# Patient Record
Sex: Male | Born: 1970 | Race: White | Hispanic: No | Marital: Married | Smoking: Never smoker
Health system: Southern US, Community
[De-identification: ages and names within clinical notes are randomized; demographics above are authoritative.]

## PROBLEM LIST (undated history)

## (undated) DIAGNOSIS — I472 Ventricular tachycardia: Secondary | ICD-10-CM

## (undated) DIAGNOSIS — T8600 Unspecified complication of bone marrow transplant: Secondary | ICD-10-CM

## (undated) DIAGNOSIS — I4721 Torsades de pointes: Secondary | ICD-10-CM

## (undated) DIAGNOSIS — I2699 Other pulmonary embolism without acute cor pulmonale: Secondary | ICD-10-CM

## (undated) DIAGNOSIS — G629 Polyneuropathy, unspecified: Secondary | ICD-10-CM

## (undated) DIAGNOSIS — C9100 Acute lymphoblastic leukemia not having achieved remission: Secondary | ICD-10-CM

## (undated) DIAGNOSIS — I10 Essential (primary) hypertension: Secondary | ICD-10-CM

## (undated) DIAGNOSIS — I469 Cardiac arrest, cause unspecified: Secondary | ICD-10-CM

## (undated) DIAGNOSIS — I82409 Acute embolism and thrombosis of unspecified deep veins of unspecified lower extremity: Secondary | ICD-10-CM

## (undated) DIAGNOSIS — J45909 Unspecified asthma, uncomplicated: Secondary | ICD-10-CM

## (undated) HISTORY — DX: Polyneuropathy, unspecified: G62.9

## (undated) HISTORY — PX: BONE MARROW TRANSPLANT: SHX200

## (undated) HISTORY — DX: Ventricular tachycardia: I47.2

## (undated) HISTORY — DX: Torsades de pointes: I47.21

## (undated) HISTORY — PX: COLONOSCOPY: SHX174

---

## 1999-06-18 ENCOUNTER — Encounter: Payer: Self-pay | Admitting: Emergency Medicine

## 1999-06-18 ENCOUNTER — Emergency Department (HOSPITAL_COMMUNITY): Admission: EM | Admit: 1999-06-18 | Discharge: 1999-06-18 | Payer: Self-pay | Admitting: Emergency Medicine

## 2004-01-03 ENCOUNTER — Emergency Department (HOSPITAL_COMMUNITY): Admission: EM | Admit: 2004-01-03 | Discharge: 2004-01-03 | Payer: Self-pay | Admitting: Family Medicine

## 2004-03-15 ENCOUNTER — Ambulatory Visit: Payer: Self-pay | Admitting: Internal Medicine

## 2004-03-28 ENCOUNTER — Ambulatory Visit: Payer: Self-pay | Admitting: Pulmonary Disease

## 2004-03-28 ENCOUNTER — Ambulatory Visit (HOSPITAL_BASED_OUTPATIENT_CLINIC_OR_DEPARTMENT_OTHER): Admission: RE | Admit: 2004-03-28 | Discharge: 2004-03-28 | Payer: Self-pay | Admitting: Internal Medicine

## 2004-05-04 ENCOUNTER — Ambulatory Visit: Payer: Self-pay | Admitting: Pulmonary Disease

## 2004-06-08 ENCOUNTER — Ambulatory Visit: Payer: Self-pay | Admitting: Pulmonary Disease

## 2004-08-21 ENCOUNTER — Ambulatory Visit: Payer: Self-pay | Admitting: Internal Medicine

## 2005-07-28 ENCOUNTER — Encounter (INDEPENDENT_AMBULATORY_CARE_PROVIDER_SITE_OTHER): Payer: Self-pay | Admitting: *Deleted

## 2005-07-28 ENCOUNTER — Inpatient Hospital Stay (HOSPITAL_COMMUNITY): Admission: EM | Admit: 2005-07-28 | Discharge: 2005-08-16 | Payer: Self-pay | Admitting: Family Medicine

## 2005-08-06 ENCOUNTER — Ambulatory Visit: Payer: Self-pay | Admitting: Gastroenterology

## 2005-08-09 ENCOUNTER — Encounter (INDEPENDENT_AMBULATORY_CARE_PROVIDER_SITE_OTHER): Payer: Self-pay | Admitting: *Deleted

## 2005-08-16 ENCOUNTER — Encounter: Payer: Self-pay | Admitting: Internal Medicine

## 2005-09-09 ENCOUNTER — Ambulatory Visit: Payer: Self-pay | Admitting: Gastroenterology

## 2005-10-03 ENCOUNTER — Ambulatory Visit: Payer: Self-pay | Admitting: Internal Medicine

## 2005-10-04 ENCOUNTER — Ambulatory Visit: Payer: Self-pay | Admitting: Internal Medicine

## 2005-10-10 ENCOUNTER — Ambulatory Visit: Payer: Self-pay | Admitting: Internal Medicine

## 2005-10-22 ENCOUNTER — Ambulatory Visit: Payer: Self-pay | Admitting: Gastroenterology

## 2005-11-05 ENCOUNTER — Ambulatory Visit: Payer: Self-pay | Admitting: Gastroenterology

## 2006-01-21 ENCOUNTER — Ambulatory Visit: Payer: Self-pay | Admitting: Internal Medicine

## 2006-10-03 ENCOUNTER — Encounter: Payer: Self-pay | Admitting: Internal Medicine

## 2008-08-03 ENCOUNTER — Emergency Department (HOSPITAL_COMMUNITY): Admission: EM | Admit: 2008-08-03 | Discharge: 2008-08-03 | Payer: Self-pay | Admitting: Emergency Medicine

## 2010-06-01 NOTE — H&P (Signed)
NAME:  TRAYTON, SZABO NO.:  [id number]   MEDICAL RECORD NO.:  [id number]          PATIENT TYPE:  INP   LOCATION:  1824                         FACILITY:  [facility]   PHYSICIAN:  Sandria Bales. Ezzard Standing, M.D.  DATE OF BIRTH:  08-27-1970   DATE OF ADMISSION:  07/28/2005  DATE OF DISCHARGE:                                HISTORY & PHYSICAL   HISTORY OF PRESENT ILLNESS:  This is a pleasant, otherwise healthy 40-year-  old white male who has seen Dr. __________ in the past for a possible sleep  disorder, who presents with a history of abdominal pain which began at about  12 o'clock yesterday.  He thought at first it was gas, had some nausea, took  some gas medicines, had some diarrhea, then the pain seemed to worsen last  night and this morning where he came to the emergency room.  He has got some  peritoneal signs, when he hits a bump or coughs it hurts.  He denies any  history of peptic ulcer disease, liver disease, pancreatic disease, or colon  disease.  He had bilateral inguinal hernias repaired as an infant in  [place].   PAST MEDICAL HISTORY:   ALLERGIES:  HE IS ALLERGIC TO SULFA.   MEDICATIONS:  He takes Claritin for allergies on a seasonal basis, Prilosec  for symptomatic reflux on an occasional basis, but is on no chronic  medications.   REVIEW OF SYSTEMS:  NEUROLOGIC:  No seizure or loss of consciousness.  PULMONARY:  Does smoke cigarettes.  No history of pneumonia or tuberculosis.  CARDIAC:  Denies heart disease, chest pain, or hypertension.  GASTROINTESTINAL:  No history of peptic ulcer disease or liver disease, see  history of present illness.  UROLOGIC:  No kidney stones or kidney infections.   His mother is at his bedside, as is I think his brother, his wife is gone to  the beach with their two small children.   He works as a Architect.   PHYSICAL EXAMINATION:  VITAL SIGNS:  His temperature was 101.6, it is now  down to  99.3; his pulse is 86; respirations 16; blood pressure 137/82.   GENERAL:  He is a well-nourished, pleasant, sick-looking white male.  HEENT:  Unremarkable.  NECK:  His neck is supple.  I felt no mass or thyromegaly.  LUNGS:  Clear to auscultation, but only a modest inspiratory effort.  HEART:  He has a regular rate and rhythm.  ABDOMEN:  He has a diffusely quiet abdomen with tenderness and guarding in  the right lower quadrant with mild rebound.  EXTREMITIES:  He has good strength in all four extremities.  NEUROLOGIC:  Grossly intact.   LABORATORY DATA:  A white blood count of 20,700, and a hemoglobin of 15;  hematocrit 45.  His sodium is 135; potassium 3.7; chloride of 107.   IMPRESSION:  My impression is that he has a probable appendicitis, he has  already actually drank contrast.  He certainly has evidence of a surgical  abdomen.  I gave him the option of having a computed tomography  scan which  would delay surgery maybe one to two hours or longer, or proceeding with  surgery.  But I think that clinically he has enough evidence of acute  abdomen and peritonitis he would be best served by proceeding with  laparoscopic exploration with most likely an appendicitis.  Other  possibilities would be diverticulitis, some other inflammatory disease, or  some benign condition, at which time I would still take his appendix out.   The potential complications are included but not limited to bleeding,  infection, possibility of open surgery, possibility of colostomy or  resection of bowel.  He wishes to go ahead with surgery.      Sandria Bales. Ezzard Standing, M.D.  Electronically Signed     DHN/MEDQ  D:  07/28/2005  T:  07/28/2005  Job:  [id number]   cc:   Gordy Savers, M.D. [facility]  [address] [place]  [place] [address]

## 2010-06-01 NOTE — Discharge Summary (Signed)
NAME:  [name], [name] NO.:  [id number]   MEDICAL RECORD NO.:  [id number]           PATIENT TYPE:   LOCATION:                                 FACILITY:   PHYSICIAN:  Sandria Bales. Ezzard Standing, M.D.       DATE OF BIRTH:   DATE OF ADMISSION:  07/28/2005  DATE OF DISCHARGE:  08/16/2005                               DISCHARGE SUMMARY   DISCHARGE DIAGNOSES:  1. Perforated diverticulitis.  2. Wound infection.  3. Gastrointestinal bleed.  4. Malnutrition   OPERATIONS PERFORMED:  1. The patient underwent a laparoscopic appendectomy and oversew of      diverticular perforation on July 28, 2005.  2. This patient underwent sigmoid colectomy with primary anastomosis      on August 09, 2005.   HISTORY OF ILLNESS:  This is a 39 year old white male who is a patient  of Dr. Gordy Savers who presented with a 24-hour history of  abdominal pain which got steadily worse.   His only significant past history is that he had bilateral inguinal  hernias as an infant.   He was allergic to SULFA.   On the day of admission, he presented with a temperature of 101.6 and a  white blood count of 20,700.  He was felt to have probable appendicitis.  He had evidence of a surgical abdomen.  I thought it would be best to  take him to the operating room for exploration.   He underwent a laparoscopic exploration on July 28, 2005 and was found  to actually have a normal-appearing appendix but had evidence of a  sigmoid colon diverticulitis.   I oversewed this laparoscopically and then placed Tisseel on this.   Postoperative, he was kept n.p.o.  He seemed to slowly get better for  about 4 or 5 days, and then seemed to hit a wall by August 06, 2005.  His  pain seemed to be getting worse.  We decreased his diet to clear liquids  and obtained a CT scan.   On August 07, 2005, the CT scan showed evidence of a small perforation,  with fluid to the right of the sigmoid colon.  He was afebrile, had a  normal white blood count, and was not tachycardic, and essentially had a  fairly benign abdominal exam.  However, he had a worsening appearing CT  scan.   I discussed with a patient about the options for further treatment,  which included either feeding, exploration with an ostomy, or attempted  bowel prep, with operation later in the week.   I spent a good amount of time with his wife also talking about the  options.  He did have evidence of a GI bleed, with a drop in his  hemoglobin to around 8.7.  We tried to gently prep his bowel, and then  on August 09, 2005 he ws taken to the operating room, where he underwent a  sigmoid colectomy with primary anastomosis.   Postoperatively, he did well.  He was placed on ticarcillin as an  antibiotic.  By the third postop day, he had  evidence of a wound  infection, so I opened his wound up.  He otherwise seemed to be doing  pretty well.  I had put him on TPN because of his prolonged keeping on  n.p.o.  By August 13, 2005, his fourth postop day, his white blood count  was 9400. his hemoglobin 7.9, and his temperature 99.8.  He appeared to  be slowly improving, again with his wound open, on TPN, on the  ticarcillin.   Finally by August 16, 2005, he was afebrile.  His wound was doing fairly  well, and he was ready for discharge.   DISCHARGE INSTRUCTIONS:  Included eat lightly for several days.  He was  given Vicodin for pain.  We arranged home health care to help with the  nursing with the wound care at home, and he is going to see me back in 7-  10 days for a wound check.  He was actually sent home on Cipro as an  antibiotic and Vicodin for pain, Protonix as needed.  He was also  instructed to follow up Dr. Russella Dar.   His first pathology on his appendix showed mild periappendicitis, but no  other abnormality of the appendix.  His pathology on the colon showed  diverticulitis with perforation and peri-intestinal abscess formation.  He had lymph nodes  which were benign, and there was no evidence of  atypia or malignancy in the specimen.   FINAL DISCHARGE CONDITION:  Good.      Sandria Bales. Ezzard Standing, M.D.  Electronically Signed     DHN/MEDQ  D:  09/24/2006  T:  09/24/2006  Job:  [id number]   cc:   Gordy Savers, MD

## 2010-06-01 NOTE — Op Note (Signed)
NAME:  [name] [name], MOSSA NO.:  [id number]   MEDICAL RECORD NO.:  [id number]          PATIENT TYPE:  INP   LOCATION:  5713                         FACILITY:  [facility]   PHYSICIAN:  Sandria Bales. Ezzard Standing, M.D.  DATE OF BIRTH:  December 23, 1970   DATE OF PROCEDURE:  08/09/2005  DATE OF DISCHARGE:                                 OPERATIVE REPORT   PREOPERATIVE DIAGNOSIS:  Diverticulitis.   POSTOPERATIVE DIAGNOSIS:  Focal diverticulitis of the mid sigmoid colon.   PROCEDURE:  Sigmoid colectomy.   SURGEON:  Sandria Bales. Ezzard Standing, M.D.   FIRST ASSISTANT:  Junious Silk, PA   ANESTHESIA:  General endotracheal.   ESTIMATED BLOOD LOSS:  100 mL.   DRAINS:  Left in:  None.   PROCEDURE:  [name] [name] is a 40 year old white male who actually came to the  emergency room on July 15 with right lower quadrant pain, leukocytosis and  fever, and was felt to have a possible appendicitis.  A laparoscopy of the  appendix was normal, and he had perforated sigmoid diverticular disease.  At  that time, I tried to laparoscopically over-sew the perforation to see if he  could get by without an open laparotomy.   The patient, however, has done well for about a week until he started having  some pain on eating.  A CT scan suggested a possible small abscess around  the sigmoid colon.   I spent a long time talking to the patient and his wife about the options  for either trying to continue to feed him versus prepping his bowel and see  if we could resect either a segment of bowel or give him a colostomy.   The indications and potential complications of the procedure were explained  to the patient.  The potential complications include, but are not limited  to, bleeding, infection, the need for a colostomy.   OPERATIVE NOTE:  The patient now comes to the operating room.  He has been marked for a  colostomy site.  His abdomen is shaved.  He has a Foley catheter in place,  an NG tube in place, and is on  Tygacil; actually, he has been on this since,  I think, he first came in the door.   The abdomen was prepped with Betadine solution and sterilely draped.  I went  through a low midline incision and entered the abdominal cavity.  He did  have an inflammatory mass in the anterior wall of his lower abdomen, and I  took this down and the small bowel and the colon were stuck up against it.   I did over sew a serosal tear of the small bowel.  The small bowel was  otherwise okay.  I took down some adhesions of the small bowel.  The colon  looked as if it had tried to wall itself off, and my guess is that if we  could have stayed out of his belly or kept him n.p.o. for maybe another 5-7  days, this may have worked.  He really had no contamination.  His focus of  diverticular disease covered  only about a 2 to 3 cm segment of the sigmoid  colon, and therefore I thought it was worth going on and trying to do a  primary resection with anastomosis.   I resected probably about a 6 to 8 inch segment of sigmoid colon, which was  the mid sigmoid colon.  I divided the proximal and distal end with Kocher  clamps.  I used Kelly clamps, 2-0 silk sutures, and the Ligasure to divide  the mesentery of the colon.  I really did not go down very far in the  mesentery of the sigmoid colon.  I never got into the retroperitoneum, and  therefore I did not try to identify a ureter.  After dividing the colon, I  then did a hand-sewn end-to-end anastomosis using interrupted 3-0 silk  sutures in an inverting fashion with Gambee inverting sutures on the  anterior part of the colon.   I inspected the colon and could easily get a fingerbreadth or a  fingerbreadth and a half through the anastomosis.  I probed it with a  hemostat and saw no defects in my anastomosis.  I then irrigated the abdomen  and closed the mesentery with 2-0 silk sutures.  I then went back and looked  at the small bowel again, and it all looked okay  except for the area that  had been stuck up to the abscess.  He really had no contamination except for  his loculated inflammatory mass in his lower midline abdomen.  I irrigated  his abdomen with 4 L of saline.  I checked for the NG tube placement.  At  the end of the procedure, I did bring the omentum down and tried to cover  the wound, and tried to bring the omentum down all the way to the pelvis and  his sigmoid anastomosis.  I did infiltrate the fascia with 30 mL of  Marcaine.  I then closed the wound with 2 running #1-PDS sutures that I tied  in the mid wound.  I did place 2 additional PDS sutures during the  procedure.   The subcutaneous tissue was irrigated, the skin closed with a skin gun.  The  patient tolerated the procedure well and was transported to the recovery  room in good condition.  Sponge and needle counts were correct at the end of  the case.      Sandria Bales. Ezzard Standing, M.D.  Electronically Signed     DHN/MEDQ  D:  08/09/2005  T:  08/10/2005  Job:  [id number]   cc:   Gordy Savers, M.D. [facility]  [address] [place]  [place] [address]

## 2010-06-01 NOTE — Op Note (Signed)
NAME:  [name] [name], [name] [name] NO.:  [id number]   MEDICAL RECORD NO.:  [id number]          PATIENT TYPE:  INP   LOCATION:  1824                         FACILITY:  [facility]   PHYSICIAN:  Sandria Bales. Ezzard Standing, M.D.  DATE OF BIRTH:  Dec 24, 1970   DATE OF PROCEDURE:  07/28/2005  DATE OF DISCHARGE:                                 OPERATIVE REPORT   PREOPERATIVE DIAGNOSIS:  Appendicitis.   POSTOPERATIVE DIAGNOSIS:  Perforated sigmoid colon diverticulitis.   PROCEDURE:  Appendectomy and oversew of diverticular perforation with  application of Tisseel.   SURGEON:  Ovidio Kin, M.D.   ANESTHESIA:  General endotracheal.   ESTIMATED BLOOD LOSS:  Minimal.   INDICATIONS FOR PROCEDURE:  [name] [name] is a 40 year old white male, who is  in otherwise good health, who over the last 36 hours has had increasing  abdominal pain.  He at first thought he had gas and presented to the [facility] Emergency Room with increasing abdominal tenderness, leukocytosis,  fever, and signs and symptoms of probable appendicitis.  He drank contrast  and I offered him proceeding with CT scan versus just going to the OR, and  he decided to go to the OR for laparoscopic exploration and probable  appendectomy.   The patient agreed to go ahead with surgery.  I discussed with him the  options for treatment, which include laparoscopic appendectomy, also the  possibility this could be something else, such as diverticular disease or  Crohn's disease, and the risks including bleeding, infection, possibility of  open laparotomy and end colostomy.   OPERATIVE NOTE:  The patient placed in a supine position with the left arm  tucked and right arm out.  Foley catheter in place.  NG tube in place.  Given 2 g of cefoxitin at initiation of procedure.   The patient's abdomen was then prepped and draped with Betadine solution and  sterilely draped.  An infraumbilical incision was made with sharp dissection  and carried down  to the abdominal cavity.  A 0-degree 10-mm laparoscope was  inserted through a 12-mm Hasson trocar, and the Hasson trocar was secured  with a 0-Vicryl suture.  A 5-mm trocar was placed in the right upper  quadrant, a 10-mm trocar in the left lower quadrant.  Upon entering the  abdominal cavity, there was free purulent fluid of which I obtained cultures  for aerobes and anaerobes.   I then irrigated out the abdomen and identified first his appendix, which  appeared normal.  What was impressive is to the midline, maybe just a little  to the right of the midline, he had a loop of sigmoid colon and small bowel  stuck up onto the anterior peitoneal surface.  I pulled the small bowel  first, which was clearly walling off, or attempting to wall off, a  perforation and then freed up the sigmoid colon and identified where I  thought was the location of the perforation.   I was able to place an instrument where  the perforation was, which was not  much more than about 7 or 8 mm in size.  I thought it was amenable to  possible oversewing, irrigation, and a primary closure with a delayed colon  surgery at a later date.   I placed two 2-0 Vicryl sutures around where the perforation was located.  I  then covered this wound with Tisseel.  I irrigated the abdomen with 5 L of  saline to it being totally clear.   The remainder of the abdominal exploration was unremarkable.  The liver,  Gall bladder,and stomach all appeared normal.   I then removed the trocars, and in turn there was no bleeding at the trocar  sites.  The umbilical port was closed with 0-Vicryl suture.  The skin was  closed with 5-0 Vicryl suture, painted with tincture of benzoin and Steri-  Strips.  The patient tolerated the procedure well and was transported to the  recovery room in good condition.  Sponge and needle counts were correct at  the end of the case.      Sandria Bales. Ezzard Standing, M.D.  Electronically Signed     DHN/MEDQ   D:  07/29/2005  T:  07/29/2005  Job:  [id number]   cc:   Gordy Savers, M.D. [facility]  [address] [place]  [place] [address]

## 2010-06-01 NOTE — Procedures (Signed)
NAME:  [name] [name], WAHLSTROM NO.:  [id number]   MEDICAL RECORD NO.:  [id number]          PATIENT TYPE:  OUT   LOCATION:  SLEEP CENTER                 FACILITY:  [facility]   PHYSICIAN:  Marcelyn Bruins, M.D. [facility] DATE OF BIRTH:  1970/03/25   DATE OF STUDY:  03/28/2004                              NOCTURNAL POLYSOMNOGRAM   DATE OF STUDY:  March 28, 2004   REFERRING PHYSICIAN:  Dr. Eleonore Chiquito   EPWORTH SCORE:  16   SLEEP ARCHITECTURE:  The patient had a total sleep time of 365 minutes with  decreased REM and slow wave sleep. Sleep efficiency was 80%. Sleep onset  latency was prolonged at 72 minutes and REM latency was at the upper limits  of normal.   IMPRESSION:  1.  Small numbers of obstructive events which do not meet the respiratory      disturbance index criteria for the obstructive sleep apnea syndrome. The      patient did have mild to moderate snoring and moderate numbers of      nonspecific arousals. This raises the question of the upper airway      resistant syndrome, especially in light of his history of choking      episodes during the night. Clinical correlation is suggested, however if      the patient is experiencing severe daytime sleepiness referral for sleep      medicine evaluation is recommended.  2.  No clinically significant cardiac arrhythmias.  3.  Large numbers of leg jerks with mild to moderate sleep disruption.      Again, clinical correlation is suggested.      KC/MEDQ  D:  04/05/2004 13:01:42  T:  04/05/2004 18:13:56  Job:  [id number]

## 2010-06-01 NOTE — Assessment & Plan Note (Signed)
[facility]                              [facility] OFFICE NOTE   NAME:Wedekind, LADELL LEA                     MRN:          [id number]  DATE:10/10/2005                            DOB:          10-16-1970    The patient is a 40 year old gentleman who is seen today for a health exam.  He was hospitalized two months ago for focal diverticulitis of the mid  sigmoid.  He required a sigmoid colectomy at that time.  He has seen Dr.  Russella Dar and is scheduled for a full colonoscopy later this year.  His past  medical history is otherwise fairly unremarkable.  He has seen Dr. Shelle Iron in  the past for possible obstructive sleep apnea.  This was excluded.  He is  felt to have restless leg syndrome but presently is doing well off  medication.   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  Due to his recent illness, he is  presently on Medicaid.  He is married with children.   PHYSICAL EXAMINATION:  GENERAL:  Revealed a mildly overweight male in no  acute distress.  VITAL SIGNS:  Blood pressure 120/80 on repeat.  HEENT:  Fundi, ear, nose and throat clear.  NECK:  No adenopathy or thyroid enlargement.  CHEST:  Clear.  CARDIOVASCULAR:  Normal heart sounds, no murmurs.  ABDOMEN:  Soft and nontender.  He did have a surgical scar below the  umbilicus with secondary healing.  GENITOURINARY:  External genitalia normal.  EXTREMITIES:  Negative with full peripheral pulses.   IMPRESSION:  Status post sigmoid colectomy for diverticular disease.  History of restless leg syndrome.  Mild exogenous obesity.   DISPOSITION:  He will be maintained on a high fiber diet and off  medications.  Return here in one or two years or p.r.n.            ______________________________  Gordy Savers, MD     PFK/MedQ  DD:  10/10/2005  DT:  10/12/2005  Job #:  [id number]

## 2010-06-01 NOTE — Assessment & Plan Note (Signed)
[facility]                           GASTROENTEROLOGY OFFICE NOTE   NAME:[name], [name] EMEL                     MRN:          [id number]  DATE:09/09/2005                            DOB:          [birth year]    [name] [name] returns following hospitalization for sigmoid colon  diverticulitis, status post sigmoid colectomy.  He has melena and  hematemesis during his hospitalization.  Upper endoscopy revealed erosive  duodenitis and non-erosive gastritis.  RUT was negative.  All NSAID products  were discontinued and he had no recurrent bleeding.  He has been maintained  on Prilosec since discharge and has an occasional episode of reflux.  He was  previously on Protonix for reflux and this seemed to give him better control  of his symptoms.  He notes no recurrent bleeding, change in bowel habits or  change in stool caliber.  He has some mild abdominal pain at his would that  is healing by secondary intention.   CURRENT MEDICATIONS:  Listed on the chart of date reviewed.   ALLERGIES:  Medication allergies SULFA DRUGS and ATIVAN.   EXAMINATION:  GENERAL:  No acute distress.  VITAL SIGNS:  Height 5 feet 11 inches.  Weight 198.4.  Blood pressure  122/64.  Pulse 76 and regular.  CHEST:  Clear to auscultation bilaterally.  CARDIAC:  Regular rate and rhythm without murmurs appreciated.  ABDOMEN:  Soft, non-tender with normoactive bowel sounds. There is a wound  healing by secondary intention in his mid-abdomen with very mild tenderness  adjacent to the wound.   ASSESSMENT/PLAN:  1. Erosive duodenitis.  Non-erosive gastritis. Gastroesophageal reflux      disease.  Maintained on proton pump inhibitor on a daily basis.  Will      change to Protonix 40 mg q. day for improved symptom control.  Continue      to avoid aspirin and nonsteroidal antiinflammatory drug products.  2. Status post sigmoid colectomy for diverticulitis.  Will plan to proceed      with  colonoscopy to screen for other colonic disorders once he has more      fully recovered from his sigmoid colectomy.  This will be tentatively      scheduled for October or November.                                   Venita Lick. Pleas Koch., MD, Clement[facility] Graham   MTS/MedQ  DD:  09/09/2005  DT:  09/10/2005  Job #:  [id number]   cc:   Sandria Bales. Ezzard Standing, MD

## 2011-02-25 ENCOUNTER — Emergency Department (HOSPITAL_COMMUNITY)
Admission: EM | Admit: 2011-02-25 | Discharge: 2011-02-25 | Disposition: A | Discharge status: Other Acute Inpatient Hospital | Payer: Medicaid Other | Attending: Emergency Medicine | Admitting: Emergency Medicine

## 2011-02-25 ENCOUNTER — Emergency Department (HOSPITAL_COMMUNITY): Payer: Medicaid Other

## 2011-02-25 ENCOUNTER — Encounter (HOSPITAL_COMMUNITY): Payer: Self-pay

## 2011-02-25 DIAGNOSIS — R509 Fever, unspecified: Secondary | ICD-10-CM | POA: Insufficient documentation

## 2011-02-25 DIAGNOSIS — C9100 Acute lymphoblastic leukemia not having achieved remission: Secondary | ICD-10-CM | POA: Insufficient documentation

## 2011-02-25 DIAGNOSIS — R197 Diarrhea, unspecified: Secondary | ICD-10-CM

## 2011-02-25 DIAGNOSIS — R000 Tachycardia, unspecified: Secondary | ICD-10-CM | POA: Insufficient documentation

## 2011-02-25 DIAGNOSIS — Z9481 Bone marrow transplant status: Secondary | ICD-10-CM | POA: Insufficient documentation

## 2011-02-25 DIAGNOSIS — R112 Nausea with vomiting, unspecified: Secondary | ICD-10-CM | POA: Insufficient documentation

## 2011-02-25 HISTORY — DX: Unspecified complication of bone marrow transplant: T86.00

## 2011-02-25 LAB — COMPREHENSIVE METABOLIC PANEL
AST: 35 U/L (ref 0–37)
Alkaline Phosphatase: 80 U/L (ref 39–117)
BUN: 12 mg/dL (ref 6–23)
Calcium: 8.9 mg/dL (ref 8.4–10.5)
GFR calc Af Amer: >90 mL/min (ref >90)
GFR calc non Af Amer: 82 mL/min — ABNORMAL LOW (ref >90)
Glucose, Bld: 181 mg/dL — ABNORMAL HIGH (ref 70–99)
Potassium: 3.6 mEq/L (ref 3.5–5.1)
Total Bilirubin: 0.2 mg/dL — ABNORMAL LOW (ref 0.3–1.2)
Total Protein: 5.9 g/dL — ABNORMAL LOW (ref 6.0–8.3)

## 2011-02-25 LAB — DIFFERENTIAL
Eosinophils Absolute: 0.4 10*3/uL (ref 0.0–0.7)
Eosinophils Relative: 3 % (ref 0–5)
Lymphocytes Relative: 17 % (ref 12–46)
Monocytes Relative: 11 % (ref 3–12)
Neutro Abs: 7.6 10*3/uL (ref 1.7–7.7)
Neutrophils Relative %: 70 % (ref 43–77)

## 2011-02-25 LAB — URINALYSIS, ROUTINE W REFLEX MICROSCOPIC
Bilirubin Urine: NEGATIVE
Ketones, ur: NEGATIVE mg/dL
Specific Gravity, Urine: 1.019 (ref 1.005–1.030)
Urobilinogen, UA: 0.2 mg/dL (ref 0.0–1.0)
pH: 6 (ref 5.0–8.0)

## 2011-02-25 LAB — CBC
Hemoglobin: 12.7 g/dL — ABNORMAL LOW (ref 13.0–17.0)
MCH: 32.5 pg (ref 26.0–34.0)
MCV: 94.6 fL (ref 78.0–100.0)
RDW: 14.2 % (ref 11.5–15.5)

## 2011-02-25 MED ORDER — DEXTROSE 5 % IV SOLN
3.3750 g | Freq: Once | INTRAVENOUS | Status: AC
  Administered 2011-02-25: 3.375 g via INTRAVENOUS
  Filled 2011-02-25: qty 3.38

## 2011-02-25 MED ORDER — ACETAMINOPHEN 325 MG PO TABS
650.0000 mg | ORAL_TABLET | Freq: Once | ORAL | Status: AC
  Administered 2011-02-25: 650 mg via ORAL
  Filled 2011-02-25: qty 2

## 2011-02-25 MED ORDER — SODIUM CHLORIDE 0.9 % IV BOLUS (SEPSIS)
1000.0000 mL | Freq: Once | INTRAVENOUS | Status: AC
  Administered 2011-02-25: 1000 mL via INTRAVENOUS

## 2011-02-25 MED ORDER — HYDROMORPHONE HCL PF 1 MG/ML IJ SOLN
1.0000 mg | Freq: Once | INTRAMUSCULAR | Status: AC
  Administered 2011-02-25: 1 mg via INTRAVENOUS
  Filled 2011-02-25: qty 1

## 2011-02-25 MED ORDER — VANCOMYCIN HCL IN DEXTROSE 1-5 GM/200ML-% IV SOLN
1000.0000 mg | Freq: Once | INTRAVENOUS | Status: AC
  Administered 2011-02-25: 1000 mg via INTRAVENOUS
  Filled 2011-02-25: qty 200

## 2011-02-25 MED ORDER — PROCHLORPERAZINE EDISYLATE 5 MG/ML IJ SOLN
10.0000 mg | Freq: Four times a day (QID) | INTRAMUSCULAR | Status: DC | PRN
  Administered 2011-02-25: 10 mg via INTRAVENOUS
  Filled 2011-02-25: qty 2

## 2011-02-25 NOTE — ED Notes (Signed)
Dr. Read Drivers at the bedside talking with pt and family member.

## 2011-02-25 NOTE — ED Provider Notes (Signed)
History     CSN: [id number]  Arrival date & time 02/25/11  0209   None     Chief Complaint  Patient presents with  . N/V/D      HPI  History provided by the patient and spouse. 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 Patient is still on suppressive treatments from his transplant. Symptoms came on acutely and are persistent. Symptoms are described as severe. There are associated with fever, chills and riggers. Patient reports having similar symptoms several months ago requiring immediate intervention and hospitalizations. Patient denies any recent travel or known sick contacts. Denies any other symptoms. Patient has not taken anything for his symptoms to this point.     Past Medical History  Diagnosis Date  . Bone marrow transplant complication     History reviewed. No pertinent past surgical history.  History reviewed. No pertinent family history.  History  Substance Use Topics  . Smoking status: Not on file  . Smokeless tobacco: Not on file  . Alcohol Use: No      Review of Systems  Allergies  Sulfonamide derivatives  Home Medications  No current outpatient prescriptions on file.  BP 129/77  Pulse 118  Temp(Src) 99.3 F (37.4 C) (Oral)  Resp 18  SpO2 97%  Physical Exam  Nursing note and vitals reviewed. Constitutional: He is oriented to person, place, and time. He appears well-developed and well-nourished. No distress.       Active riggers  HENT:  Head: Normocephalic and atraumatic.  Neck: Normal range of motion. Neck supple.  Cardiovascular: Regular rhythm.  Tachycardia present.   Pulmonary/Chest: Effort normal and breath sounds normal. No respiratory distress. He has no wheezes.  Abdominal: Soft. Bowel sounds are normal. He exhibits no distension. There is no tenderness. There  is no rebound and no guarding.  Neurological: He is alert and oriented to person, place, and time.  Skin: Skin is warm and dry. No rash noted.  Psychiatric: He has a normal mood and affect. His behavior is normal.    ED Course  Procedures    Labs Reviewed  CBC  DIFFERENTIAL  COMPREHENSIVE METABOLIC PANEL  URINALYSIS, ROUTINE W REFLEX MICROSCOPIC  URINE CULTURE  CULTURE, BLOOD (ROUTINE X 2)  CULTURE, BLOOD (ROUTINE X 2)  LACTIC ACID, PLASMA   Results for orders placed during the hospital encounter of 02/25/11  CBC      Component Value Range   WBC 11.0 (*) 4.0 - 10.5 (K/uL)   RBC 3.91 (*) 4.22 - 5.81 (MIL/uL)   Hemoglobin 12.7 (*) 13.0 - 17.0 (g/dL)   HCT 21.3 (*) 08.6 - 52.0 (%)   MCV 94.6  78.0 - 100.0 (fL)   MCH 32.5  26.0 - 34.0 (pg)   MCHC 34.3  30.0 - 36.0 (g/dL)   RDW 57.8  46.9 - 62.9 (%)   Platelets 286  150 - 400 (K/uL)  DIFFERENTIAL      Component Value Range   Neutrophils Relative 70  43 - 77 (%)   Neutro Abs 7.6  1.7 - 7.7 (K/uL)   Lymphocytes Relative 17  12 - 46 (%)   Lymphs Abs 1.8  0.7 - 4.0 (K/uL)   Monocytes Relative 11  3 - 12 (%)   Monocytes Absolute 1.2 (*) 0.1 - 1.0 (K/uL)   Eosinophils Relative 3  0 - 5 (%)   Eosinophils Absolute 0.4  0.0 - 0.7 (K/uL)   Basophils Relative 0  0 - 1 (%)   Basophils Absolute 0.0  0.0 - 0.1 (K/uL)  COMPREHENSIVE METABOLIC PANEL      Component Value Range   Sodium 138  135 - 145 (mEq/L)   Potassium 3.6  3.5 - 5.1 (mEq/L)   Chloride 102  96 - 112 (mEq/L)   CO2 23  19 - 32 (mEq/L)   Glucose, Bld 181 (*) 70 - 99 (mg/dL)   BUN 12  6 - 23 (mg/dL)   Creatinine, Ser 4.09  0.50 - 1.35 (mg/dL)   Calcium 8.9  8.4 - 81.1 (mg/dL)   Total Protein 5.9 (*) 6.0 - 8.3 (g/dL)   Albumin 3.4 (*) 3.5 - 5.2 (g/dL)   AST 35  0 - 37 (U/L)   ALT 42  0 - 53 (U/L)   Alkaline Phosphatase 80  39 - 117 (U/L)   Total Bilirubin 0.2 (*) 0.3 - 1.2 (mg/dL)   GFR calc non Af Amer 82 (*) >90 (mL/min)   GFR calc Af Amer >90  >90 (mL/min)    URINALYSIS, ROUTINE W REFLEX MICROSCOPIC      Component Value Range   Color, Urine YELLOW  YELLOW    APPearance CLOUDY (*) CLEAR    Specific Gravity, Urine 1.019  1.005 - 1.030    pH 6.0  5.0 - 8.0    Glucose, UA NEGATIVE  NEGATIVE (mg/dL)   Hgb urine dipstick NEGATIVE  NEGATIVE    Bilirubin Urine NEGATIVE  NEGATIVE    Ketones, ur NEGATIVE  NEGATIVE (mg/dL)   Protein, ur NEGATIVE  NEGATIVE (mg/dL)   Urobilinogen, UA 0.2  0.0 - 1.0 (mg/dL)   Nitrite NEGATIVE  NEGATIVE    Leukocytes, UA NEGATIVE  NEGATIVE   LACTIC ACID, PLASMA      Component Value Range   Lactic Acid, Venous 3.0 (*) 0.5 - 2.2 (mmol/L)      Dg Chest 2 View  02/25/2011  *RADIOLOGY REPORT*  Clinical Data: Fever  CHEST - 2 VIEW  Comparison: 08/11/2005  Findings: Shallow inspiration.  Mild cardiac enlargement with normal pulmonary vascularity.  No focal airspace consolidation.  No blunting of costophrenic angles.  No pneumothorax.  Right sided power port central venous catheter with tip over the upper right atrium.  IMPRESSION: Shallow inspiration.  No evidence of active consolidation.  Original Report Authenticated By: Marlon Pel, M.D.     1. Fever       MDM  2:40 AM patient seen and evaluated. Patient in no acute distress but with active riggers.  Patient follows at [facility] bone marrow unit at [phone] they called Dr. Catha Nottingham on call who encouraged him to come to the emergency room immediately.  Patient seen and discussed with attending physician. He has made consultation at [facility]. Plan to have patient transferred. Patient currently stable with improvement of temperature after Tylenol. Antibiotics have been started.     Phill Mutter [place], [place] 02/25/11 [phone]

## 2011-02-25 NOTE — ED Notes (Signed)
Pt is 14 months out of his transplant, tonight at 2330 he started vomiting and having diarrhea

## 2011-02-25 NOTE — ED Notes (Signed)
Pt. Has been given ice packs for under the arms and the groin area to reduce 103 temp. PER RN

## 2011-02-25 NOTE — ED Notes (Signed)
[facility] transportation here for pt

## 2011-02-25 NOTE — ED Notes (Signed)
Spoke with Jess with Research scientist (life sciences) at [facility]. Transport vehicle en route to [facility] to pick up pt. Nursing report given to Britta Mccreedy, RN at [facility] and pt will be admitted to room 4835. Informed Wife and pt of room assignment.

## 2011-02-25 NOTE — ED Provider Notes (Signed)
Medical screening examination/treatment/procedure(s) were conducted as a shared visit with non-physician practitioner(s) and myself.  I personally evaluated the patient during the encounter  Patient evaluated. Vital signs are stable. Patient is sleeping. Patient's wife updated about patient's condition and plan. Arrangements made for transfer to [facility].  Hanley Seamen, MD 02/25/11 [phone]

## 2011-02-25 NOTE — ED Notes (Signed)
Report given to [facility] transportation [place]

## 2011-02-25 NOTE — ED Notes (Signed)
Pt has leukemia and has had a bone marrow transplant in 2011. Pt came in ER with elevated temperature and muscle spasms. Pt and wife report that patient usually goes to [facility] to be treated but came to [place] because of his fever. Pt has right subclavian port a cath that was accessed, blood work collected, pain medicine and antiemetic were administered per EDP orders. Pt's rectal temp found to be 103F and tylenol administered for it per EDP orders.

## 2011-02-26 LAB — URINE CULTURE
Colony Count: NO GROWTH
Culture  Setup Time: 201302110838

## 2011-03-03 LAB — CULTURE, BLOOD (ROUTINE X 2)
Culture  Setup Time: 201302110835
Culture: NO GROWTH
Culture: NO GROWTH

## 2012-07-03 ENCOUNTER — Encounter: Payer: Self-pay | Admitting: Gastroenterology

## 2012-08-05 ENCOUNTER — Emergency Department (HOSPITAL_COMMUNITY)
Admission: EM | Admit: 2012-08-05 | Discharge: 2012-08-05 | Disposition: A | Discharge status: Other Acute Inpatient Hospital | Payer: Medicare Other | Attending: Emergency Medicine | Admitting: Emergency Medicine

## 2012-08-05 ENCOUNTER — Emergency Department (HOSPITAL_COMMUNITY): Payer: Medicare Other

## 2012-08-05 ENCOUNTER — Encounter (HOSPITAL_COMMUNITY): Payer: Self-pay | Admitting: *Deleted

## 2012-08-05 DIAGNOSIS — Z856 Personal history of leukemia: Secondary | ICD-10-CM | POA: Insufficient documentation

## 2012-08-05 DIAGNOSIS — Z79899 Other long term (current) drug therapy: Secondary | ICD-10-CM | POA: Insufficient documentation

## 2012-08-05 DIAGNOSIS — Z9481 Bone marrow transplant status: Secondary | ICD-10-CM | POA: Insufficient documentation

## 2012-08-05 DIAGNOSIS — I10 Essential (primary) hypertension: Secondary | ICD-10-CM | POA: Insufficient documentation

## 2012-08-05 DIAGNOSIS — L03115 Cellulitis of right lower limb: Secondary | ICD-10-CM

## 2012-08-05 DIAGNOSIS — Z8679 Personal history of other diseases of the circulatory system: Secondary | ICD-10-CM | POA: Insufficient documentation

## 2012-08-05 DIAGNOSIS — R0609 Other forms of dyspnea: Secondary | ICD-10-CM | POA: Insufficient documentation

## 2012-08-05 DIAGNOSIS — J45909 Unspecified asthma, uncomplicated: Secondary | ICD-10-CM | POA: Insufficient documentation

## 2012-08-05 DIAGNOSIS — R0989 Other specified symptoms and signs involving the circulatory and respiratory systems: Secondary | ICD-10-CM | POA: Insufficient documentation

## 2012-08-05 DIAGNOSIS — M79609 Pain in unspecified limb: Secondary | ICD-10-CM | POA: Insufficient documentation

## 2012-08-05 DIAGNOSIS — R509 Fever, unspecified: Secondary | ICD-10-CM | POA: Insufficient documentation

## 2012-08-05 DIAGNOSIS — L02419 Cutaneous abscess of limb, unspecified: Secondary | ICD-10-CM | POA: Insufficient documentation

## 2012-08-05 HISTORY — DX: Acute embolism and thrombosis of unspecified deep veins of unspecified lower extremity: I82.409

## 2012-08-05 HISTORY — DX: Other pulmonary embolism without acute cor pulmonale: I26.99

## 2012-08-05 HISTORY — DX: Essential (primary) hypertension: I10

## 2012-08-05 HISTORY — DX: Cardiac arrest, cause unspecified: I46.9

## 2012-08-05 HISTORY — DX: Unspecified asthma, uncomplicated: J45.909

## 2012-08-05 HISTORY — DX: Acute lymphoblastic leukemia not having achieved remission: C91.00

## 2012-08-05 LAB — COMPREHENSIVE METABOLIC PANEL
ALT: 44 U/L (ref 0–53)
Alkaline Phosphatase: 92 U/L (ref 39–117)
CO2: 24 mEq/L (ref 19–32)
Chloride: 97 mEq/L (ref 96–112)
GFR calc Af Amer: >90 mL/min (ref >90)
Glucose, Bld: 185 mg/dL — ABNORMAL HIGH (ref 70–99)
Potassium: 3.7 mEq/L (ref 3.5–5.1)
Sodium: 134 mEq/L — ABNORMAL LOW (ref 135–145)
Total Protein: 6.1 g/dL (ref 6.0–8.3)

## 2012-08-05 LAB — URINALYSIS, ROUTINE W REFLEX MICROSCOPIC
Glucose, UA: NEGATIVE mg/dL
Hgb urine dipstick: NEGATIVE
Ketones, ur: NEGATIVE mg/dL
Protein, ur: NEGATIVE mg/dL

## 2012-08-05 LAB — CBC WITH DIFFERENTIAL/PLATELET
Eosinophils Absolute: 0.5 10*3/uL (ref 0.0–0.7)
Lymphocytes Relative: 12 % (ref 12–46)
Lymphs Abs: 1.7 10*3/uL (ref 0.7–4.0)
Neutro Abs: 9.8 10*3/uL — ABNORMAL HIGH (ref 1.7–7.7)
Neutrophils Relative %: 73 % (ref 43–77)
Platelets: 359 10*3/uL (ref 150–400)
RBC: 4.26 MIL/uL (ref 4.22–5.81)
WBC: 13.4 10*3/uL — ABNORMAL HIGH (ref 4.0–10.5)

## 2012-08-05 MED ORDER — VANCOMYCIN HCL 10 G IV SOLR
1250.0000 mg | Freq: Two times a day (BID) | INTRAVENOUS | Status: DC
  Filled 2012-08-05: qty 1250

## 2012-08-05 MED ORDER — LORAZEPAM 2 MG/ML IJ SOLN
1.0000 mg | Freq: Once | INTRAMUSCULAR | Status: AC
  Administered 2012-08-05: 1 mg via INTRAVENOUS
  Filled 2012-08-05: qty 1

## 2012-08-05 MED ORDER — MORPHINE SULFATE 4 MG/ML IJ SOLN
4.0000 mg | Freq: Once | INTRAMUSCULAR | Status: AC
  Administered 2012-08-05: 4 mg via INTRAVENOUS
  Filled 2012-08-05: qty 1

## 2012-08-05 MED ORDER — ACETAMINOPHEN 500 MG PO TABS
1000.0000 mg | ORAL_TABLET | Freq: Once | ORAL | Status: DC
  Filled 2012-08-05: qty 2

## 2012-08-05 MED ORDER — ONDANSETRON HCL 4 MG/2ML IJ SOLN
4.0000 mg | Freq: Once | INTRAMUSCULAR | Status: DC
  Filled 2012-08-05: qty 2

## 2012-08-05 MED ORDER — VANCOMYCIN HCL 10 G IV SOLR
1250.0000 mg | Freq: Once | INTRAVENOUS | Status: AC
  Administered 2012-08-05: 1250 mg via INTRAVENOUS
  Filled 2012-08-05: qty 1250

## 2012-08-05 MED ORDER — IOHEXOL 350 MG/ML SOLN
100.0000 mL | Freq: Once | INTRAVENOUS | Status: AC | PRN
  Administered 2012-08-05: 100 mL via INTRAVENOUS

## 2012-08-05 NOTE — ED Provider Notes (Signed)
History    CSN: [id number] Arrival date & time 08/05/12  0002  First MD Initiated Contact with Patient 08/05/12 0016     Chief Complaint  Patient presents with  . 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  Patient returned from [place] today.   Now complains of chest pain, dyspnea, fever, right lower extremity tenderness.   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    History of DVT and pulmonary embolism.   Level V caveat for urgent need for intervention. Past Medical History  Diagnosis Date  . Bone marrow transplant complication   . Pulmonary embolism   . DVT (deep venous thrombosis)   . Cardiac arrest   . ALL (acute lymphoblastic leukemia) dx'd 07/16/2008    clinical trial dc'd 2010; bone marrow 11/2008  . Asthma     inhlaer prn  . Hypertension    Past Surgical History  Procedure Laterality Date  . Bone marrow transplant     History reviewed. No pertinent family history. History  Substance Use Topics  . Smoking status: Never Smoker   . Smokeless tobacco: Not on file  . Alcohol Use: No    Review of Systems  Unable to perform ROS: Acuity of condition    Allergies  Zofran and Sulfonamide derivatives  Home Medications   Current Outpatient Rx  Name  Route  Sig  Dispense  Refill  . amLODipine (NORVASC) 10 MG tablet   Oral   Take 10 mg by mouth daily.         . ARIPiprazole (ABILIFY) 2 MG tablet   Oral   Take 2 mg by mouth daily.         . beclomethasone (QVAR) 40 MCG/ACT inhaler   Inhalation   Inhale 2 puffs into the lungs 2 (two) times daily.         . cefpodoxime (VANTIN) 200 MG tablet   Oral   Take 200 mg by mouth 2 (two) times daily.         . cetirizine (ZYRTEC) 10 MG tablet   Oral   Take 10 mg by mouth daily.         . cyclobenzaprine (FLEXERIL) 10 MG tablet    Oral   Take 10 mg by mouth 3 (three) times daily as needed for muscle spasms.         Marland Kitchen dronabinol (MARINOL) 2.5 MG capsule   Oral   Take 2.5 mg by mouth 2 (two) times daily before a meal.         . escitalopram (LEXAPRO) 20 MG tablet   Oral   Take 20 mg by mouth daily.         Marland Kitchen gabapentin (NEURONTIN) 300 MG capsule   Oral   Take 300 mg by mouth 3 (three) times daily.         . hydrochlorothiazide (HYDRODIURIL) 25 MG tablet   Oral   Take 25 mg by mouth daily.         Marland Kitchen lisinopril (PRINIVIL,ZESTRIL) 10 MG tablet   Oral   Take 10 mg by mouth daily.         Marland Kitchen omeprazole (PRILOSEC) 20 MG capsule   Oral   Take 20 mg by mouth daily.         . sildenafil (REVATIO) 20 MG tablet   Oral   Take 60 mg by mouth daily as needed. For erectile  dysfunction         . traMADol (ULTRAM-ER) 200 MG 24 hr tablet   Oral   Take 200 mg by mouth daily.         . valACYclovir (VALTREX) 500 MG tablet   Oral   Take 500 mg by mouth daily.          BP 145/83  Temp(Src) 100 F (37.8 C) (Rectal)  Resp 33  Ht 5\' 10"  (1.778 m)  Wt 217 lb (98.431 kg)  BMI 31.14 kg/m2  SpO2 100% Physical Exam  Nursing note and vitals reviewed. Constitutional: He is oriented to person, place, and time.  Pale, looks ill  HENT:  Head: Normocephalic and atraumatic.  Eyes: Conjunctivae and EOM are normal. Pupils are equal, round, and reactive to light.  Neck: Normal range of motion. Neck supple.  Cardiovascular: Normal rate, regular rhythm and normal heart sounds.   Pulmonary/Chest: Effort normal and breath sounds normal.  Abdominal: Soft. Bowel sounds are normal.  Musculoskeletal: Normal range of motion.  Neurological: He is alert and oriented to person, place, and time.  Skin:  Right lower extremity: Erythematous and tender from distal tibia to foot  Psychiatric: He has a normal mood and affect.    ED Course  Procedures (including critical care time) Labs Reviewed  CBC WITH  DIFFERENTIAL - Abnormal; Notable for the following:    WBC 13.4 (*)    Neutro Abs 9.8 (*)    Monocytes Absolute 1.4 (*)    All other components within normal limits  COMPREHENSIVE METABOLIC PANEL - Abnormal; Notable for the following:    Sodium 134 (*)    Glucose, Bld 185 (*)    Albumin 3.3 (*)    AST 43 (*)    Total Bilirubin 0.2 (*)    GFR calc non Af Amer 80 (*)    All other components within normal limits  D-DIMER, QUANTITATIVE - Abnormal; Notable for the following:    D-Dimer, Quant 0.80 (*)    All other components within normal limits  CULTURE, BLOOD (ROUTINE X 2)  CULTURE, BLOOD (ROUTINE X 2)  URINALYSIS, ROUTINE W REFLEX MICROSCOPIC  TROPONIN I   Ct Angio Chest Pe W/cm &/or Wo Cm  08/05/2012   *RADIOLOGY REPORT*  Clinical Data: Chest heaviness.  Low grade fever.  Shortness of breath.  Recent immobilization from travel.  Elevated D-dimer.  CT ANGIOGRAPHY CHEST  Technique:  Multidetector CT imaging of the chest using the standard protocol during bolus administration of intravenous contrast. Multiplanar reconstructed images including MIPs were obtained and reviewed to evaluate the vascular anatomy.  Contrast: OMNIPAQUE IOHEXOL 350 MG/ML SOLN  Comparison: None.  Findings: Technically adequate study with moderately good opacification of the central and segmental pulmonary arteries.  No focal filling defects are demonstrated.  No evidence of significant pulmonary embolus.  Mild cardiac enlargement.  The central venous catheter with tip at the time of the right atrium.  Normal caliber thoracic aorta.  No dissection.  Esophagus is decompressed.  No significant lymphadenopathy in the chest.  Thyroid gland is homogeneous.  No pleural effusions.  There is atelectasis in the lung bases bilaterally.  No focal consolidation or airspace disease.  No pneumothorax.  Visualized portions of the upper abdominal organs demonstrate fatty infiltration of the liver.  Normal alignment of the thoracic  spine. The  IMPRESSION: No evidence of significant pulmonary embolus.  Atelectasis in both lung bases.  Fatty infiltration of liver.   Original Report Authenticated By: Chrissie Noa  Andria Meuse, M.D.   Dg Chest Portable 1 View  08/05/2012   *RADIOLOGY REPORT*  Clinical Data: Sudden onset chest pain and pressure.  History of bone marrow transplant 2 years ago.  PORTABLE CHEST - 1 VIEW  Comparison: 02/25/2011  Findings: Power port type central venous catheter is unchanged in position.  The shallow inspiration.  Mild cardiac enlargement. Pulmonary vascularity is normal.  Linear atelectasis in the left lung base.  No focal consolidation or airspace disease.  No blunting of costophrenic angles.  No pneumothorax.  IMPRESSION: Shallow inspiration.  Linear atelectasis in the left lung base. Mild cardiac enlargement.  No evidence of active pulmonary disease.   Original Report Authenticated By: Burman Nieves, M.D.   1. Cellulitis of right lower extremity    CRITICAL CARE Performed by: Donnetta Hutching Total critical care time: 30 Critical care time was exclusive of separately billable procedures and treating other patients. Critical care was necessary to treat or prevent imminent or life-threatening deterioration. Critical care was time spent personally by me on the following activities: development of treatment plan with patient and/or surrogate as well as nursing, discussions with consultants, evaluation of patient's response to treatment, examination of patient, obtaining history from patient or surrogate, ordering and performing treatments and interventions, ordering and review of laboratory studies, ordering and review of radiographic studies, pulse oximetry and re-evaluation of patient's condition. MDM  CT angiogram of chest negative for pulmonary embolism.   Will start IV vancomycin for cellulitis of right lower extremity. Vital signs are stable. Pain management. Transfer to [facility].   Discussed with Dr.  Chelsea Primus, MD 08/05/12 [phone]

## 2012-08-05 NOTE — Progress Notes (Signed)
ANTIBIOTIC CONSULT NOTE - INITIAL  Pharmacy Consult for Vancomycin Indication: RLE cellulitis  Allergies  Allergen Reactions  . Zofran (Ondansetron Hcl) Other (See Comments)    QT prolonged.Marland KitchenMarland KitchenUse compazine or phenergan  . Sulfonamide Derivatives     Patient Measurements: Height: 5\' 10"  (177.8 cm) Weight: 217 lb (98.431 kg) IBW/kg (Calculated) : 73   Vital Signs: Temp: 99 F (37.2 C) (07/23 0542) Temp src: Oral (07/23 0542) BP: 143/78 mmHg (07/23 0542) Pulse Rate: 101 (07/23 0542) Intake/Output from previous day: 07/22 0701 - 07/23 0700 In: -  Out: 300 [Urine:300] Intake/Output from this shift: Total I/O In: -  Out: 300 [Urine:300]  Labs:  Recent Labs  08/05/12 0019  WBC 13.4*  HGB 13.8  PLT 359  CREATININE 1.13   Estimated Creatinine Clearance: 102.3 ml/min (by C-G formula based on Cr of 1.13). No results found for this basename: VANCOTROUGH, VANCOPEAK, VANCORANDOM, GENTTROUGH, GENTPEAK, GENTRANDOM, TOBRATROUGH, TOBRAPEAK, TOBRARND, AMIKACINPEAK, AMIKACINTROU, AMIKACIN,  in the last 72 hours   Microbiology: No results found for this or any previous visit (from the past 720 hour(s)).  Medical History: Past Medical History  Diagnosis Date  . Bone marrow transplant complication   . Pulmonary embolism   . DVT (deep venous thrombosis)   . Cardiac arrest   . ALL (acute lymphoblastic leukemia) dx'd 07/16/2008    clinical trial dc'd 2010; bone marrow 11/2008  . Asthma     inhlaer prn  . Hypertension     Medications:  Scheduled:  . acetaminophen  1,000 mg Oral Once  . ondansetron  4 mg Intravenous Once   Infusions:  . 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 Patient returned from [place] today. Now complains of chest pain, dyspnea, fever, right lower extremity tenderness. 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   Goal of Therapy:  Vancomycin trough level 10-15 mcg/ml  Plan:   Vancomycin 1250mg  IV q12h.  CrCl~90 (N)  F/U SCr/levels/cultures as needed.  Susanne Greenhouse R 08/05/2012,6:00 AM

## 2012-08-05 NOTE — ED Notes (Signed)
Pt reports acute onset of chest heaviness that began this evening when pt got up out of bed, pt w/ hx of bone marrow transplant x2 yrs ago and DVT/PE and cardiac arrest. Pt admits to shortness of breath and some dizziness. Pt has had extended immobilization d/t recent travel. Pt is currently A&Ox4.

## 2012-08-11 LAB — CULTURE, BLOOD (ROUTINE X 2): Culture: NO GROWTH

## 2012-10-27 ENCOUNTER — Encounter: Payer: Self-pay | Admitting: Cardiology

## 2012-10-27 DIAGNOSIS — I472 Ventricular tachycardia: Secondary | ICD-10-CM | POA: Insufficient documentation

## 2012-10-27 DIAGNOSIS — I82409 Acute embolism and thrombosis of unspecified deep veins of unspecified lower extremity: Secondary | ICD-10-CM | POA: Insufficient documentation

## 2012-10-27 DIAGNOSIS — J45909 Unspecified asthma, uncomplicated: Secondary | ICD-10-CM | POA: Insufficient documentation

## 2012-10-27 DIAGNOSIS — C9100 Acute lymphoblastic leukemia not having achieved remission: Secondary | ICD-10-CM | POA: Insufficient documentation

## 2012-10-27 DIAGNOSIS — I10 Essential (primary) hypertension: Secondary | ICD-10-CM | POA: Insufficient documentation

## 2012-10-27 DIAGNOSIS — T8600 Unspecified complication of bone marrow transplant: Secondary | ICD-10-CM | POA: Insufficient documentation

## 2012-10-27 DIAGNOSIS — G629 Polyneuropathy, unspecified: Secondary | ICD-10-CM | POA: Insufficient documentation

## 2012-10-27 DIAGNOSIS — I469 Cardiac arrest, cause unspecified: Secondary | ICD-10-CM | POA: Insufficient documentation

## 2012-10-30 ENCOUNTER — Ambulatory Visit: Payer: Medicare Other | Admitting: Cardiology

## 2012-11-13 ENCOUNTER — Ambulatory Visit: Payer: Medicare Other | Admitting: Cardiology

## 2013-04-08 ENCOUNTER — Other Ambulatory Visit: Payer: Self-pay | Admitting: Cardiology

## 2013-04-17 ENCOUNTER — Encounter: Payer: Self-pay | Admitting: *Deleted

## 2013-08-06 ENCOUNTER — Telehealth: Payer: Self-pay

## 2013-08-06 NOTE — Telephone Encounter (Signed)
He needs appt for refills. He was due in October 2014.

## 2013-08-08 ENCOUNTER — Other Ambulatory Visit: Payer: Self-pay

## 2013-09-29 ENCOUNTER — Other Ambulatory Visit (HOSPITAL_COMMUNITY): Payer: Self-pay | Admitting: *Deleted

## 2013-09-29 ENCOUNTER — Ambulatory Visit (HOSPITAL_COMMUNITY)
Admission: RE | Admit: 2013-09-29 | Discharge: 2013-09-29 | Disposition: A | Discharge status: Home / Self Care | Payer: Medicare Other | Source: Ambulatory Visit | Attending: Cardiology | Admitting: Cardiology

## 2013-09-29 DIAGNOSIS — X749XXA Intentional self-harm by unspecified firearm discharge, initial encounter: Secondary | ICD-10-CM | POA: Diagnosis not present

## 2013-10-14 DIAGNOSIS — 419620001 Death: Secondary | SNOMED CT | POA: Insufficient documentation

## 2013-10-14 DEATH — deceased

## 2014-07-06 IMAGING — CT CT ANGIO CHEST
1 of 2 series · 19 of 32 positions shown · IV contrast (OMNIPAQUE 350)
Comparison: None.

CLINICAL DATA: Chest heaviness.  Low grade fever.  Shortness of
breath.  Recent immobilization from travel.  Elevated D-dimer.

CT ANGIOGRAPHY CHEST
TECHNIQUE: Multidetector CT imaging of the chest using the
standard protocol during bolus administration of intravenous
contrast. Multiplanar reconstructed images including MIPs were
obtained and reviewed to evaluate the vascular anatomy.
Contrast: 100mL OMNIPAQUE IOHEXOL 350 MG/ML SOLN

[Series 10: thins for pacs · axial · 0.79mm/px · z∈[-247,-28]mm · 19 of 245 slices shown]
[im 13/245  lung]
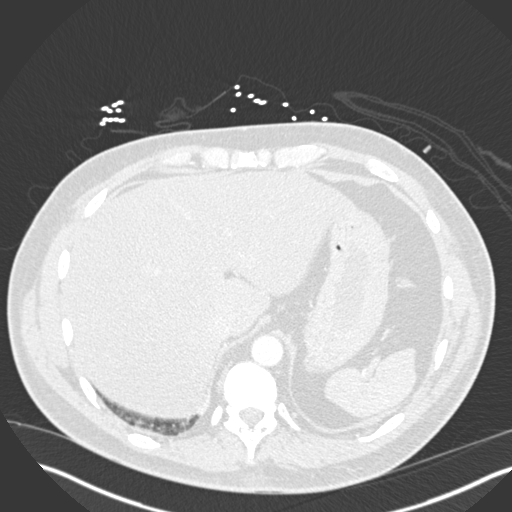
[im 25/245  mediastinal]
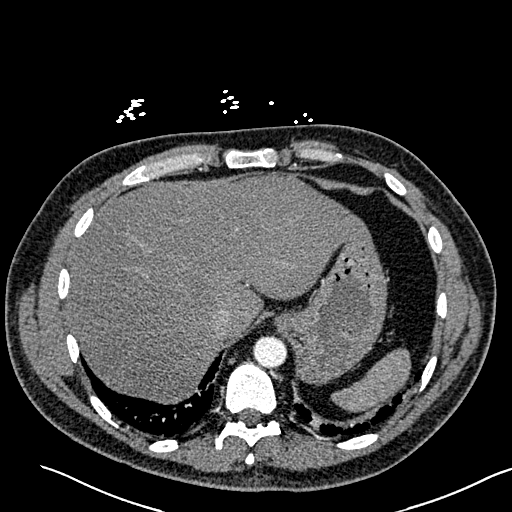
[im 37/245  lung]
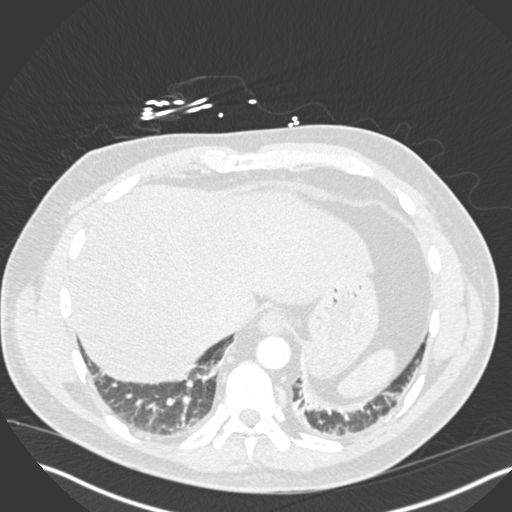
[im 62/245  mediastinal]
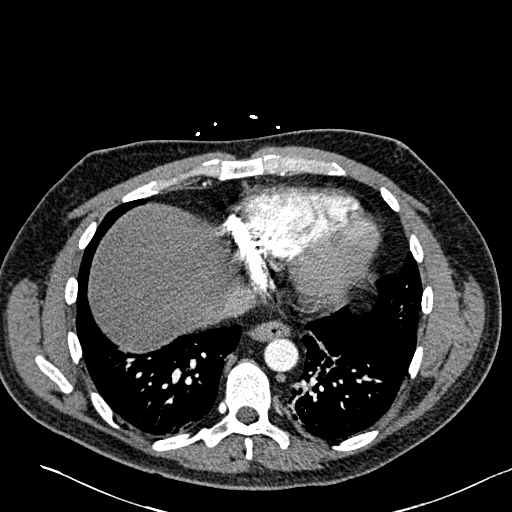
[im 74/245  lung]
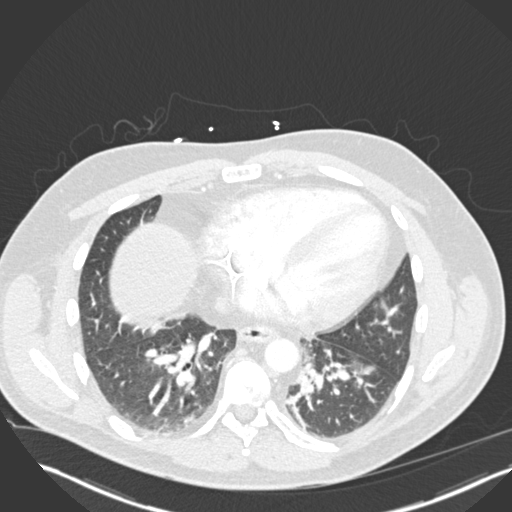
[im 82/245  mediastinal]
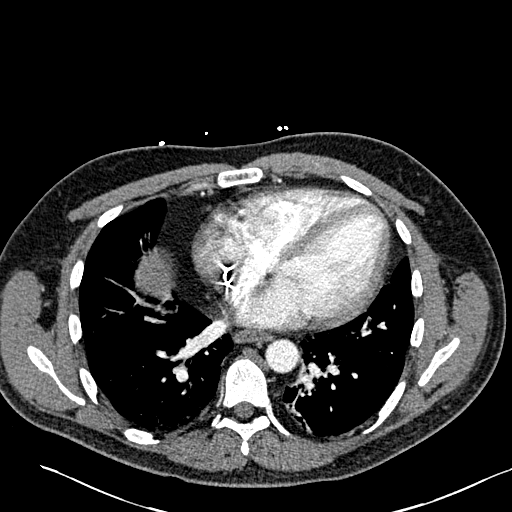
[im 86/245  lung]
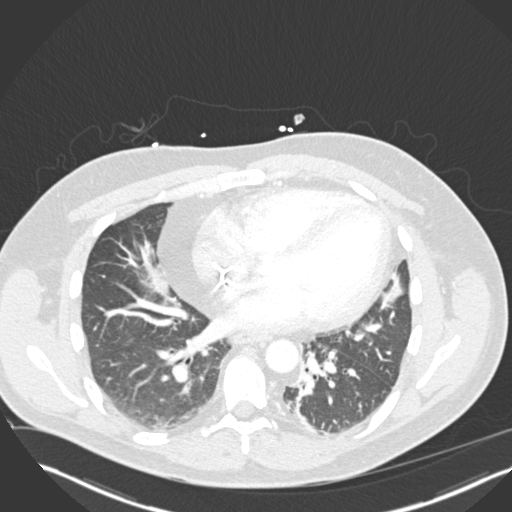
[im 98/245  mediastinal]
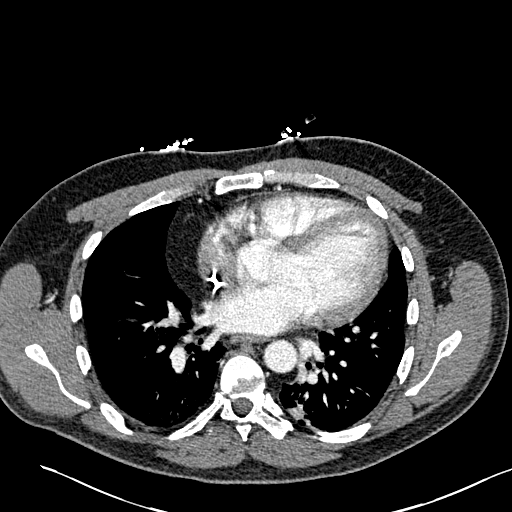
[im 110/245  lung]
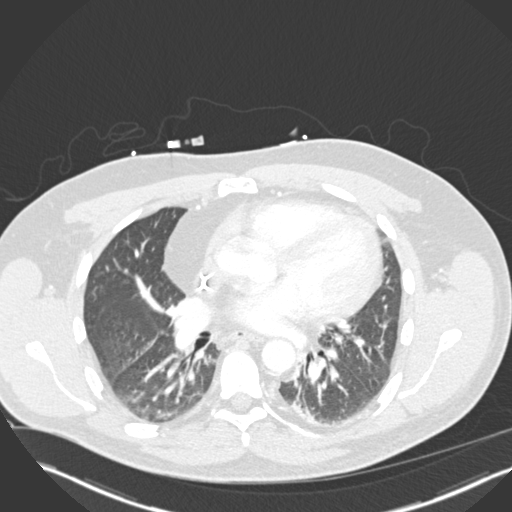
[im 123/245  mediastinal]
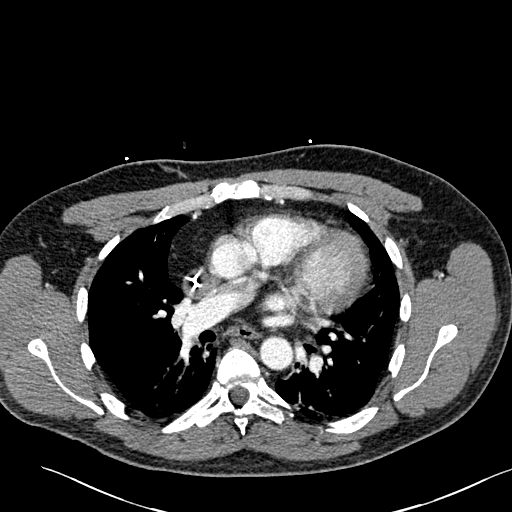
[im 135/245  lung]
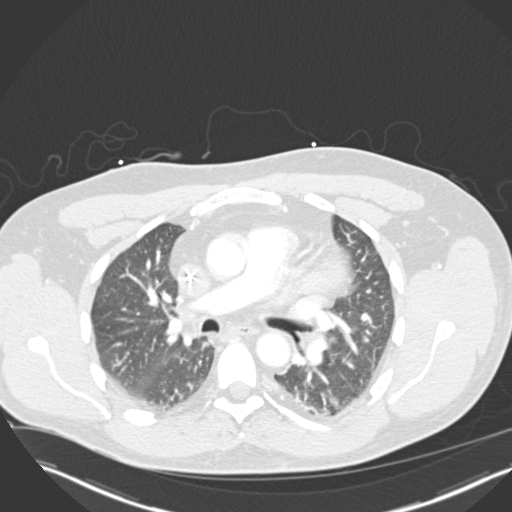
[im 147/245  mediastinal]
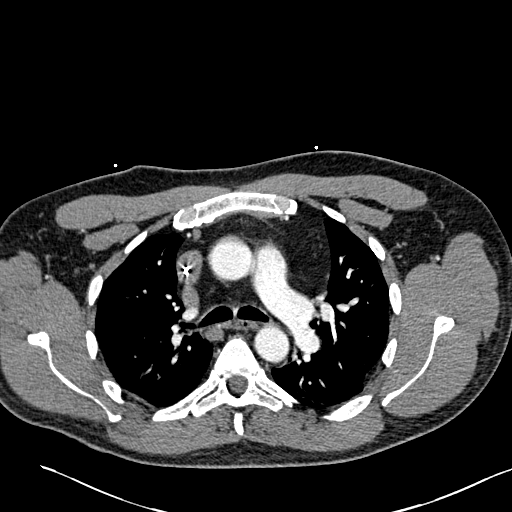
[im 159/245  lung]
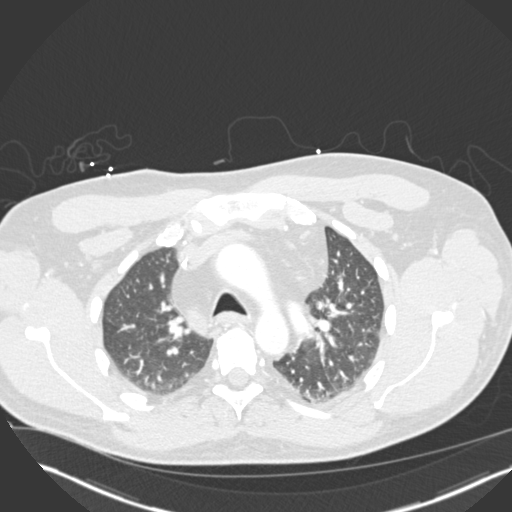
[im 163/245  mediastinal]
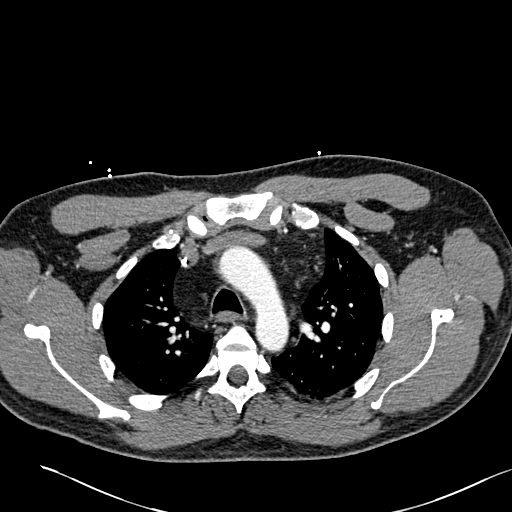
[im 171/245  lung]
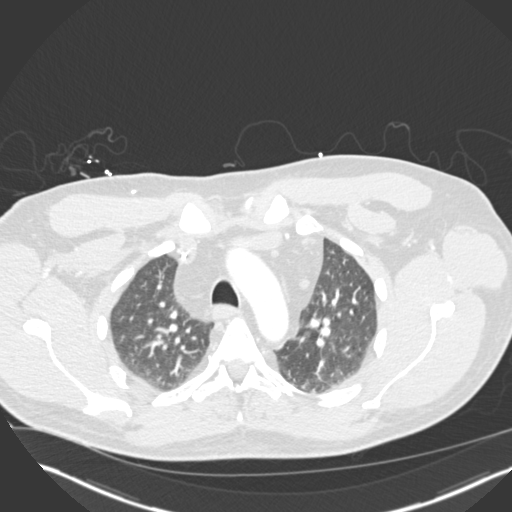
[im 184/245  mediastinal]
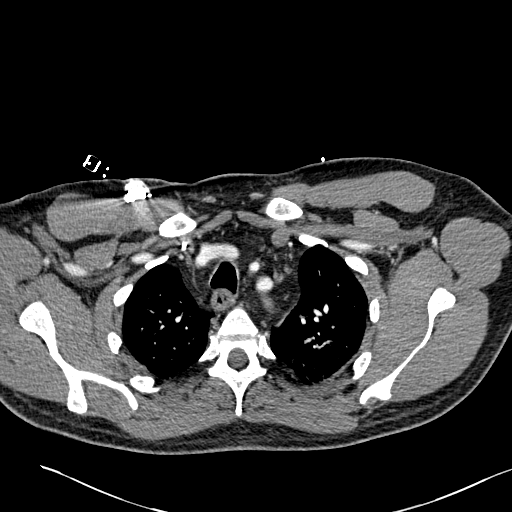
[im 208/245  lung]
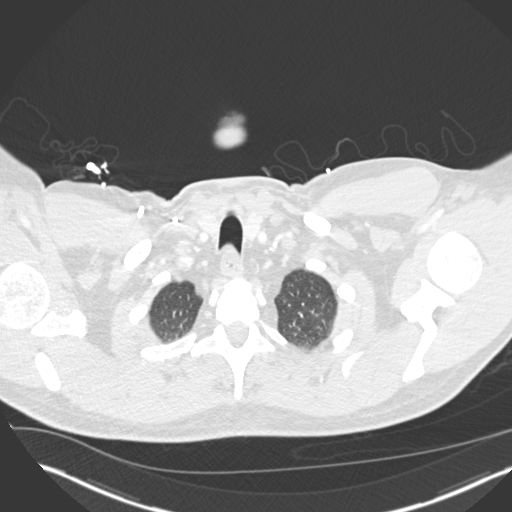
[im 220/245  mediastinal]
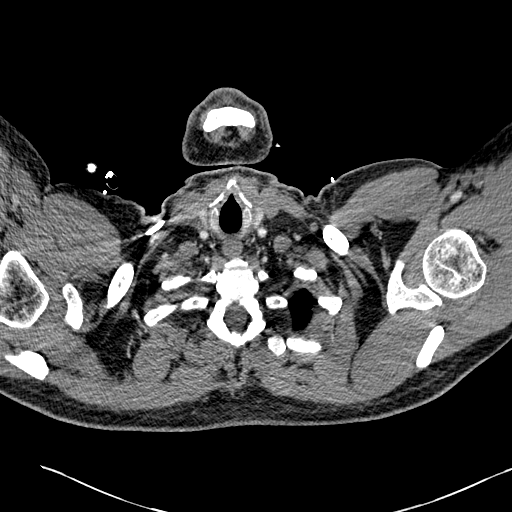
[im 232/245  lung]
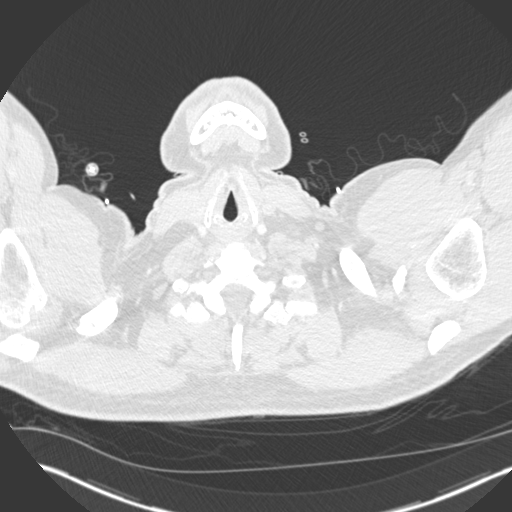

[19 of 32 positions shown; findings below may reference images not displayed]

FINDINGS: Technically adequate study with moderately good
opacification of the central and segmental pulmonary arteries.  No
focal filling defects are demonstrated.  No evidence of significant
pulmonary embolus.

Mild cardiac enlargement.  The central venous catheter with tip at
the time of the right atrium.  Normal caliber thoracic aorta.  No
dissection.  Esophagus is decompressed.  No significant
lymphadenopathy in the chest.  Thyroid gland is homogeneous.  No
pleural effusions.  There is atelectasis in the lung bases
bilaterally.  No focal consolidation or airspace disease.  No
pneumothorax.

Visualized portions of the upper abdominal organs demonstrate fatty
infiltration of the liver.  Normal alignment of the thoracic spine.
The
IMPRESSION: No evidence of significant pulmonary embolus.  Atelectasis in both
lung bases.  Fatty infiltration of liver.

## 2014-07-06 IMAGING — CR DG CHEST 1V PORT
1 series · 1 of 1 positions shown · non-contrast
Comparison: 02/25/2011

CLINICAL DATA: Sudden onset chest pain and pressure.  History of
bone marrow transplant 2 years ago.

PORTABLE CHEST - 1 VIEW

[AP]
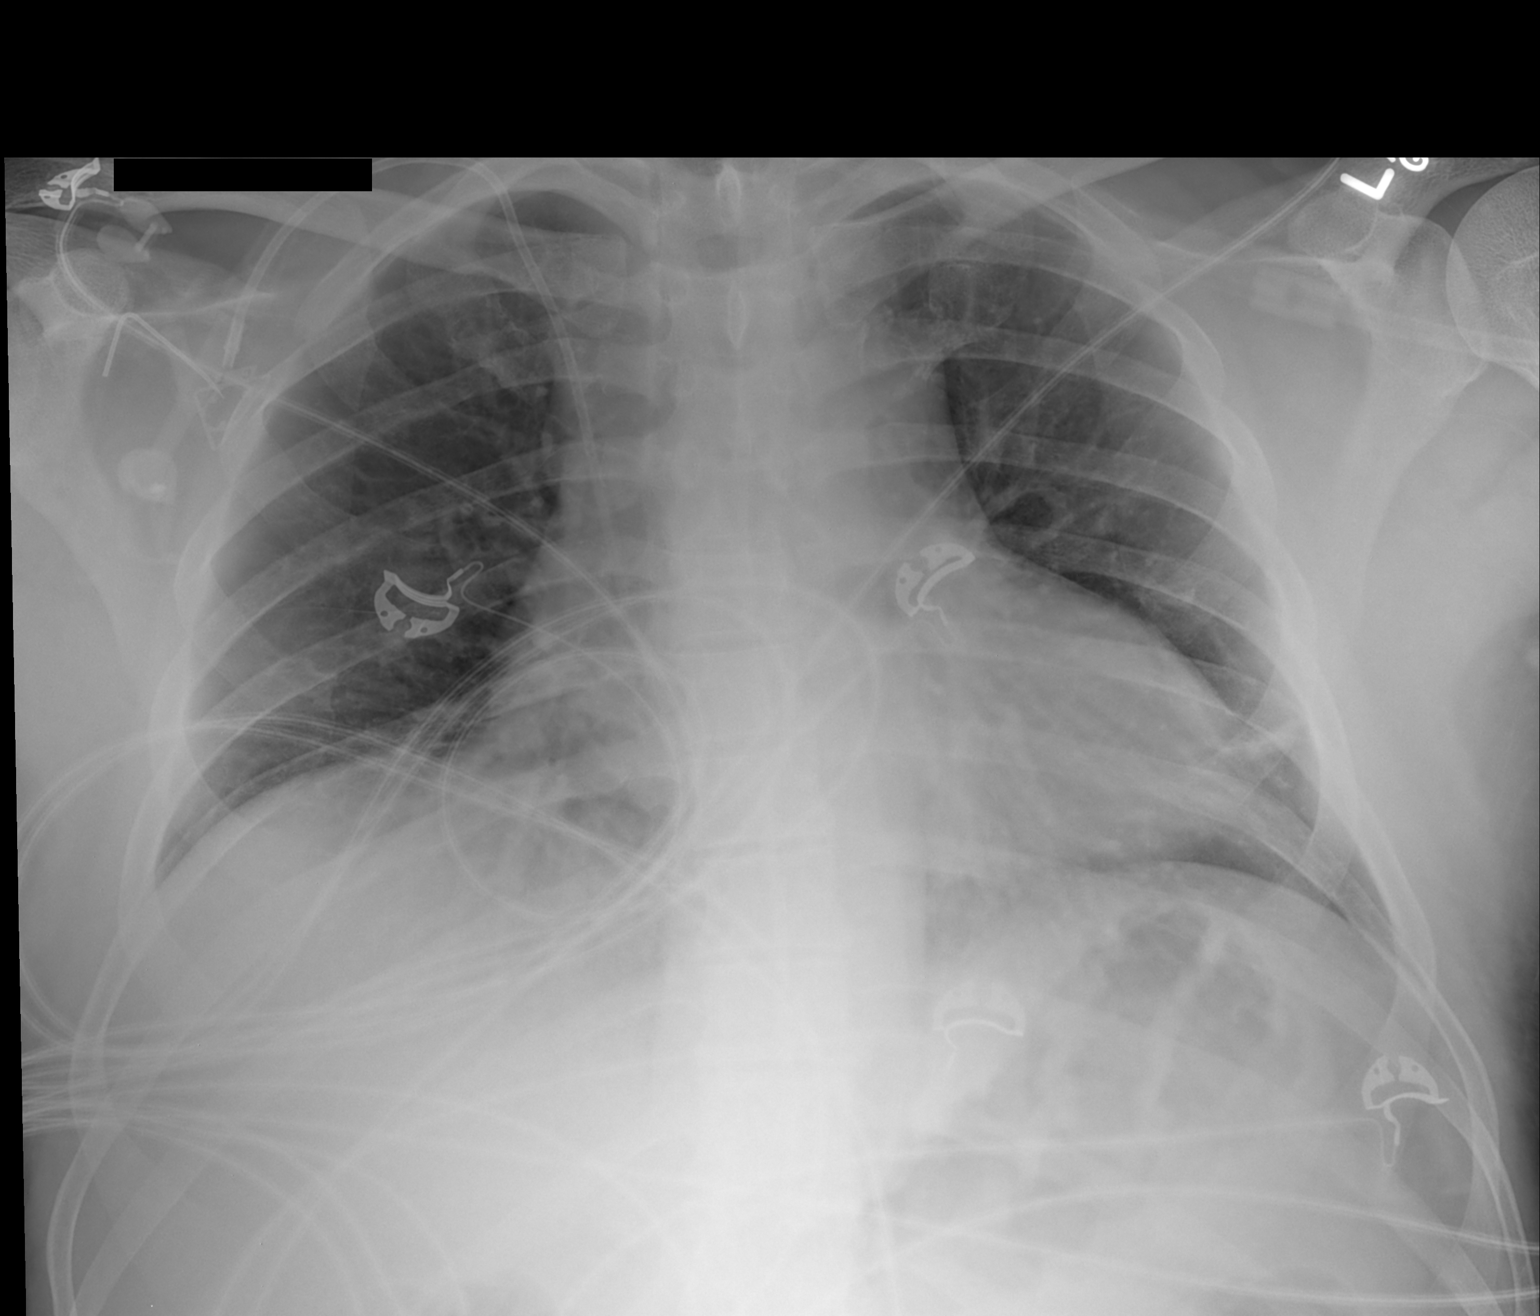

[1 of 1 positions shown; findings below may reference images not displayed]

FINDINGS: Power port type central venous catheter is unchanged in
position.  The shallow inspiration.  Mild cardiac enlargement.
Pulmonary vascularity is normal.  Linear atelectasis in the left
lung base.  No focal consolidation or airspace disease.  No
blunting of costophrenic angles.  No pneumothorax.
IMPRESSION: Shallow inspiration.  Linear atelectasis in the left lung base.
Mild cardiac enlargement.  No evidence of active pulmonary disease.

## 2022-01-29 ENCOUNTER — Encounter: Payer: Self-pay | Admitting: Gastroenterology
